# Patient Record
Sex: Male | Born: 1960 | Race: White | Hispanic: No | Marital: Married | Smoking: Former smoker
Health system: Southern US, Community
[De-identification: ages and names within clinical notes are randomized; demographics above are authoritative.]

## PROBLEM LIST (undated history)

## (undated) DIAGNOSIS — I2510 Atherosclerotic heart disease of native coronary artery without angina pectoris: Secondary | ICD-10-CM

## (undated) DIAGNOSIS — E785 Hyperlipidemia, unspecified: Secondary | ICD-10-CM

## (undated) DIAGNOSIS — Q249 Congenital malformation of heart, unspecified: Secondary | ICD-10-CM

## (undated) DIAGNOSIS — I10 Essential (primary) hypertension: Secondary | ICD-10-CM

## (undated) DIAGNOSIS — K7460 Unspecified cirrhosis of liver: Secondary | ICD-10-CM

## (undated) HISTORY — PX: LEG SURGERY: SHX1003

## (undated) HISTORY — DX: Atherosclerotic heart disease of native coronary artery without angina pectoris: I25.10

## (undated) HISTORY — PX: FINGER SURGERY: SHX640

## (undated) HISTORY — DX: Unspecified cirrhosis of liver: K74.60

---
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
## 2019-05-14 ENCOUNTER — Emergency Department (HOSPITAL_COMMUNITY)
Admission: EM | Admit: 2019-05-14 | Discharge: 2019-05-15 | Disposition: A | Discharge status: Home / Self Care | Payer: Medicare PPO | Attending: Emergency Medicine | Admitting: Emergency Medicine

## 2019-05-14 ENCOUNTER — Other Ambulatory Visit: Payer: Self-pay

## 2019-05-14 ENCOUNTER — Emergency Department (HOSPITAL_COMMUNITY): Payer: Medicare PPO

## 2019-05-14 ENCOUNTER — Encounter (HOSPITAL_COMMUNITY): Payer: Self-pay | Admitting: Emergency Medicine

## 2019-05-14 DIAGNOSIS — N200 Calculus of kidney: Secondary | ICD-10-CM | POA: Diagnosis not present

## 2019-05-14 DIAGNOSIS — I10 Essential (primary) hypertension: Secondary | ICD-10-CM | POA: Diagnosis not present

## 2019-05-14 DIAGNOSIS — R1030 Lower abdominal pain, unspecified: Secondary | ICD-10-CM | POA: Diagnosis present

## 2019-05-14 DIAGNOSIS — R748 Abnormal levels of other serum enzymes: Secondary | ICD-10-CM | POA: Insufficient documentation

## 2019-05-14 DIAGNOSIS — Z79899 Other long term (current) drug therapy: Secondary | ICD-10-CM | POA: Diagnosis not present

## 2019-05-14 DIAGNOSIS — R109 Unspecified abdominal pain: Secondary | ICD-10-CM

## 2019-05-14 HISTORY — DX: Essential (primary) hypertension: I10

## 2019-05-14 HISTORY — DX: Congenital malformation of heart, unspecified: Q24.9

## 2019-05-14 HISTORY — DX: Hyperlipidemia, unspecified: E78.5

## 2019-05-14 LAB — CBC
HCT: 48.5 % (ref 39.0–52.0)
Hemoglobin: 16.2 g/dL (ref 13.0–17.0)
MCH: 31.6 pg (ref 26.0–34.0)
MCHC: 33.4 g/dL (ref 30.0–36.0)
MCV: 94.5 fL (ref 80.0–100.0)
Platelets: 255 10*3/uL (ref 150–400)
RBC: 5.13 MIL/uL (ref 4.22–5.81)
RDW: 12.5 % (ref 11.5–15.5)
WBC: 8 10*3/uL (ref 4.0–10.5)
nRBC: 0 % (ref 0.0–0.2)

## 2019-05-14 LAB — COMPREHENSIVE METABOLIC PANEL
ALT: 41 U/L (ref 0–44)
AST: 28 U/L (ref 15–41)
Albumin: 4.2 g/dL (ref 3.5–5.0)
Alkaline Phosphatase: 65 U/L (ref 38–126)
Anion gap: 11 (ref 5–15)
BUN: 22 mg/dL — ABNORMAL HIGH (ref 6–20)
CO2: 24 mmol/L (ref 22–32)
Calcium: 9.6 mg/dL (ref 8.9–10.3)
Chloride: 101 mmol/L (ref 98–111)
Creatinine, Ser: 0.92 mg/dL (ref 0.61–1.24)
GFR calc Af Amer: >60 mL/min (ref >60)
GFR calc non Af Amer: >60 mL/min (ref >60)
Glucose, Bld: 102 mg/dL — ABNORMAL HIGH (ref 70–99)
Potassium: 3.6 mmol/L (ref 3.5–5.1)
Sodium: 136 mmol/L (ref 135–145)
Total Bilirubin: 0.6 mg/dL (ref 0.3–1.2)
Total Protein: 7.7 g/dL (ref 6.5–8.1)

## 2019-05-14 LAB — URINALYSIS, ROUTINE W REFLEX MICROSCOPIC
Bacteria, UA: NONE SEEN
Bilirubin Urine: NEGATIVE
Glucose, UA: NEGATIVE mg/dL
Ketones, ur: NEGATIVE mg/dL
Leukocytes,Ua: NEGATIVE
Nitrite: NEGATIVE
Protein, ur: NEGATIVE mg/dL
Specific Gravity, Urine: 1.017 (ref 1.005–1.030)
pH: 5 (ref 5.0–8.0)

## 2019-05-14 LAB — LIPASE, BLOOD: Lipase: 119 U/L — ABNORMAL HIGH (ref 11–51)

## 2019-05-14 MED ORDER — NAPROXEN 500 MG PO TABS: 500.0000 mg | ORAL_TABLET | Freq: Two times a day (BID) | ORAL | 0 refills | Status: DC | PRN

## 2019-05-14 MED ORDER — DICYCLOMINE HCL 20 MG PO TABS: 20.0000 mg | ORAL_TABLET | Freq: Two times a day (BID) | ORAL | 0 refills | Status: DC

## 2019-05-14 MED ORDER — ONDANSETRON 4 MG PO TBDP: 4.0000 mg | ORAL_TABLET | Freq: Three times a day (TID) | ORAL | 0 refills | Status: DC | PRN

## 2019-05-14 MED ORDER — TAMSULOSIN HCL 0.4 MG PO CAPS: 0.4000 mg | ORAL_CAPSULE | Freq: Every day | ORAL | 0 refills | Status: AC

## 2019-05-14 NOTE — ED Provider Notes (Signed)
[facility] EMERGENCY DEPARTMENT Provider Note   CSN: [id number] Arrival date & time: 05/14/19  2134     History Chief Complaint  Patient presents with  . Abdominal Pain    Jerry Rasmussen is a 59 y.o. male.  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  He reports that he was in his usual state of health until approximately 1 week ago when he started to develop some abdominal distention.  He has been able to eat and drink but states that he does not want to eat very much.  There has been no fevers or chills, no nausea or vomiting, no diarrhea though he does report that his stools have changed to be long and stringy, no blood in the stool.  The abdominal distention seems to be getting worse, initially it was in the upper abdomen but now he feels more pressure and pain in the lower abdomen and points to the suprapubic area both right and left lower quadrants.  He does report a prior history of kidney stones as well, he has not seen any blood in his urine.  He has never been diagnosed with ascites nor has he ever had paracentesis  Past Medical History:  Diagnosis Date  . Cardiac abnormality   . HTN (hypertension)   . Hyperlipidemia     There are no problems to display for this patient.   History reviewed. No pertinent surgical history.     History reviewed. No pertinent family history.  Social History   Tobacco Use  . Smoking status: Never Smoker  . Smokeless tobacco: Never Used  Substance Use Topics  . Alcohol use: Not Currently  . Drug use: Never    Home Medications Prior to  Admission medications   Medication Sig Start Date End Date Taking? Authorizing Provider  aspirin EC 325 MG tablet Take 325 mg by mouth in the morning and at bedtime.   Yes [provider]  atenolol (TENORMIN) 50 MG tablet Take 50 mg by mouth in the morning and at bedtime.   Yes [provider]  esomeprazole (NEXIUM) 40 MG capsule Take 40 mg by mouth every morning. 03/27/19  Yes [provider]  fluticasone (FLONASE) 50 MCG/ACT nasal spray Place 2 sprays into both nostrils daily. 04/23/19  Yes [provider]  lisinopril-hydrochlorothiazide (ZESTORETIC) 20-12.5 MG tablet Take 1 tablet by mouth daily.   Yes [provider]  Multiple Vitamin (MULTIVITAMIN WITH MINERALS) TABS tablet Take 1 tablet by mouth daily.   Yes [provider]  Omega-3 Fatty Acids (FISH OIL) 1200 MG CAPS Take 2 capsules by mouth daily.   Yes [provider]  REPATHA 140 MG/ML SOSY Inject 140 mg into the skin every 14 (fourteen) days. 04/22/19  Yes [provider]  simvastatin (ZOCOR) 80 MG tablet Take 80 mg by mouth daily.   Yes [provider]  dicyclomine (BENTYL) 20 MG tablet Take 1 tablet (20 mg total) by mouth 2 (two) times daily. 05/14/19   Noemi Chapel, MD  naproxen (NAPROSYN) 500 MG tablet Take 1 tablet (500 mg total) by mouth 2 (  two) times daily as needed for moderate pain. 05/14/19   Eber Hong, MD  ondansetron (ZOFRAN ODT) 4 MG disintegrating tablet Take 1 tablet (4 mg total) by mouth every 8 (eight) hours as needed for nausea. 05/14/19   Eber Hong, MD  tamsulosin (FLOMAX) 0.4 MG CAPS capsule Take 1 capsule (0.4 mg total) by mouth daily for 5 days. 05/14/19 05/19/19  Eber Hong, MD    Allergies    Codeine  Review of Systems   Review of Systems  All other systems reviewed and are negative.   Physical Exam Updated Vital Signs BP (!) 155/106 (BP Location: Right Arm)   Pulse 70   Temp 98.5 F (36.9 C) (Oral)   Resp 18   Ht  1.829 m (6')   Wt 100.2 kg   SpO2 100%   BMI 29.97 kg/m   Physical Exam Vitals and nursing note reviewed.  Constitutional:      General: He is not in acute distress.    Appearance: He is well-developed.  HENT:     Head: Normocephalic and atraumatic.     Mouth/Throat:     Pharynx: No oropharyngeal exudate.  Eyes:     General: No scleral icterus.       Right eye: No discharge.        Left eye: No discharge.     Conjunctiva/sclera: Conjunctivae normal.     Pupils: Pupils are equal, round, and reactive to light.  Neck:     Thyroid: No thyromegaly.     Vascular: No JVD.  Cardiovascular:     Rate and Rhythm: Normal rate and regular rhythm.     Heart sounds: Normal heart sounds. No murmur. No friction rub. No gallop.   Pulmonary:     Effort: Pulmonary effort is normal. No respiratory distress.     Breath sounds: Normal breath sounds. No wheezing or rales.  Abdominal:     General: Bowel sounds are normal. There is no distension.     Palpations: Abdomen is soft. There is no mass.     Tenderness: There is abdominal tenderness.     Comments: Minimal abdominal tenderness to the lower left quadrant and right lower quadrant, no guarding or peritoneal signs, no right upper quadrant tenderness or Murphy sign, no epigastric tenderness, the patient is mildly distended but has no tympanic sounds in fact is quite dull to percussion.  There is no fluid wave  Musculoskeletal:        General: No tenderness. Normal range of motion.     Cervical back: Normal range of motion and neck supple.     Comments: There is no edema of the lower extremities.  His left lower extremity has some postsurgical findings but no significant edema and at the patient's baseline  Lymphadenopathy:     Cervical: No cervical adenopathy.  Skin:    General: Skin is warm and dry.     Findings: No erythema or rash.  Neurological:     Mental Status: He is alert.     Coordination: Coordination normal.  Psychiatric:         Behavior: Behavior normal.     ED Results / Procedures / Treatments   Labs (all labs ordered are listed, but only abnormal results are displayed) Labs Reviewed  URINALYSIS, ROUTINE W REFLEX MICROSCOPIC - Abnormal; Notable for the following components:      Result Value   Hgb urine dipstick LARGE (*)    All other components within normal limits  LIPASE,  BLOOD - Abnormal; Notable for the following components:   Lipase 119 (*)    All other components within normal limits  COMPREHENSIVE METABOLIC PANEL - Abnormal; Notable for the following components:   Glucose, Bld 102 (*)    BUN 22 (*)    All other components within normal limits  CBC    EKG None  Radiology CT Renal Stone Study  Result Date: 05/14/2019 CLINICAL DATA:  Right-sided flank pain EXAM: CT ABDOMEN AND PELVIS WITHOUT CONTRAST TECHNIQUE: Multidetector CT imaging of the abdomen and pelvis was performed following the standard protocol without IV contrast. COMPARISON:  None. FINDINGS: Lower chest: Lung bases demonstrate no acute consolidation or effusion. The heart size is normal. Hepatobiliary: No focal liver abnormality is seen. No gallstones, gallbladder wall thickening, or biliary dilatation. Pancreas: Unremarkable. No pancreatic ductal dilatation or surrounding inflammatory changes. Spleen: Normal in size without focal abnormality. Adrenals/Urinary Tract: Adrenal glands are normal. Multiple intrarenal stones on the right measuring up to 3 mm at the midpole. Punctate stone in the lower pole left kidney. Mild right hydronephrosis, secondary to a 5 mm stone at the right UPJ. Urinary bladder is nearly empty. Stomach/Bowel: Stomach is within normal limits. Appendix appears normal. No evidence of bowel wall thickening, distention, or inflammatory changes. Mild sigmoid colon diverticular disease. Vascular/Lymphatic: Moderate aortic atherosclerosis without aneurysm. No significantly enlarged lymph nodes Reproductive: Prostate is  unremarkable.  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 Mild right hydronephrosis, secondary to a 5 mm stone at the right UPJ. 2. There are right greater than left intrarenal stones 3. Sigmoid colon diverticular disease without acute inflammatory process Electronically Signed   By: Jasmine Pang M.D.   On: 05/14/2019 22:36    Procedures Procedures (including critical care time)  Medications Ordered in ED Medications - No data to display  ED Course  I have reviewed the triage vital signs and the nursing notes.  Pertinent labs & imaging results that were available during my care of the patient were reviewed by me and considered in my medical decision making (see chart for details).    MDM Rules/Calculators/A&P                       This patient presents to the ED for concern of abdominal pain and distention, this involves an extensive number of treatment options, and is a complaint that carries with it a high risk of complications and morbidity.  The differential diagnosis includes cirrhosis, cholecystitis, appendicitis, diverticulitis, ascites, pancreatitis, kidney stone   Lab Tests:   I Ordered, reviewed, and interpreted labs, which included CBC, metabolic panel, lipase, urinalysis, there was mild hematuria, no leukocytosis, no renal dysfunction, borderline lipase but no correlation with abnormal findings on CT scan.   Imaging Studies ordered:   I ordered imaging studies which included CT scan renal protocol and  I independently visualized and interpreted imaging which showed no signs of pancreatitis, there is a proximal kidney stone which may be causing some of the hematuria seen on the urinalysis  Additional history obtained:   Additional history obtained from patient  Previous records obtained and reviewed -not available  Consultations  Obtained:   I consulted with the patient and discussed lab and imaging findings  Reevaluation:  After the interventions stated above, I reevaluated the patient and found the patient is very stable, has no upper abdominal tenderness whatsoever  making pancreatitis much less likely.  He is aware of the kidney stone, I think he may have some irritable bowel syndrome and will prescribe dicyclomine as well as kidney stone medications.  Avoiding opiates and giving local referrals for gastroenterology and family doctors, the patient is appreciative and stable at discharge   Final Clinical Impression(s) / ED Diagnoses Final diagnoses:  Abdominal pain, unspecified abdominal location  Elevated lipase  Kidney stone    Rx / DC Orders ED Discharge Orders         Ordered    dicyclomine (BENTYL) 20 MG tablet  2 times daily     05/14/19 2345    ondansetron (ZOFRAN ODT) 4 MG disintegrating tablet  Every 8 hours PRN     05/14/19 2345    naproxen (NAPROSYN) 500 MG tablet  2 times daily PRN     05/14/19 2345    tamsulosin (FLOMAX) 0.4 MG CAPS capsule  Daily     05/14/19 2345           Eber Hong, MD 05/14/19 2348

## 2019-05-14 NOTE — Discharge Instructions (Addendum)
Your testing today did not reveal any obvious abnormalities.  You had a slightly elevated lipase but it was not high enough to mean that you had pancreatitis.  I would recommend that you going to a clear liquid diet for the next 24 hours , the CT scan did not show any problems with your pancreas.  The kidney stone on your CT scan is approximately 5 mm, this may cause some pain as it passes so please take the following medications  Naproxen, up to 500 mg twice a day as needed for pain Zofran, 4 mg by mouth every 8 hours as needed for nausea Flomax, 1 capsule daily for the next 5 days  Your abdominal distention and discomfort and bloating may also be related to irritable bowel syndrome.  Please take dicyclomine 1 tablet every 6 hours as needed.  Please follow-up with Dr. Darrick Penna, local gastroenterology and see the list below for local family doctors.  Return to the emergency department for severe or worsening symptoms.  [facility] Primary Care Doctor List    Kari Baars MD. Specialty: Pulmonary Disease Contact information: [address]  [address]  [place] [address]  [phone]   Syliva Overman, MD. Specialty: [facility] Medicine Contact information: [address]  [place] [address]  [phone]   Lilyan Punt, MD. Specialty: [facility] Medicine Contact information: [address]  [place] [address]  [phone]   Avon Gully, MD Specialty: Internal Medicine Contact information: [address] [place] [address]  [phone]   Catalina Pizza, MD. Specialty: Internal Medicine Contact information: [address]  [place] [address]  [phone]    [facility] (Dr. Selena Batten) Specialty: Family Medicine Contact information: [address]  [place] [address]  [phone]   John Giovanni, MD. Specialty: [facility] Medicine Contact information: [address]  [address]  [place] [address]  [phone]   Carylon Perches,  MD. Specialty: Internal Medicine Contact information: [address]  [address]  [place] [address]  [phone]    [facility]  [address], [place] [address] [phone]  Services [facility] offers a variety of basic health services.  Services include but are not limited to: Blood pressure checks  Heart rate checks  Blood sugar checks  Urine analysis  Rapid strep tests  Pregnancy tests.  Health education and referrals  People needing more complex services will be directed to a physician online. Using these virtual visits, doctors can evaluate and prescribe medicine and treatments. There will be no medication on-site, though [facility] will help patients fill their prescriptions at little to no cost.   For More information please go to: [email or web address]

## 2019-05-14 NOTE — ED Triage Notes (Signed)
Patient presents with lower abdominal pain. Patient states pain started about 1 week ago. Patient does have some abdominal distention and right sided flank pain. Patient complains of nausea.Patient has had previous abdominal ultrasounds but states pain has become worse.

## 2019-07-15 ENCOUNTER — Telehealth: Payer: Self-pay | Admitting: Internal Medicine

## 2019-07-15 ENCOUNTER — Encounter: Payer: Self-pay | Admitting: Internal Medicine

## 2019-07-15 NOTE — Telephone Encounter (Signed)
Ok to schedule ov.  

## 2019-07-15 NOTE — Telephone Encounter (Signed)
ER REFERRAL  

## 2019-07-15 NOTE — Telephone Encounter (Signed)
SCHEDULED AND LETTER SENT  °

## 2019-09-03 ENCOUNTER — Telehealth: Payer: Self-pay

## 2019-09-03 ENCOUNTER — Ambulatory Visit: Payer: Medicare PPO | Admitting: Gastroenterology

## 2019-09-03 NOTE — Telephone Encounter (Signed)
Pt thought his apt was at 9 AM. Pt's spouse will need to r/s new pt appointment.

## 2019-09-03 NOTE — Telephone Encounter (Signed)
Reschedule patient to come tomorrow. He is aware.

## 2019-09-03 NOTE — Progress Notes (Deleted)
Primary Care Physician:  Patient, No Pcp Per  Primary Gastroenterologist:    No chief complaint on file.   HPI:  Jerry Rasmussen is a 59 y.o. male here    CT renal protocol April 2021 showed mild right hydronephrosis, secondary to 5 mm stone at the right UPJ.  Bilateral intrarenal stones.  Sigmoid diverticular disease.  Current Outpatient Medications  Medication Sig Dispense Refill  . aspirin EC 325 MG tablet Take 325 mg by mouth in the morning and at bedtime.    Marland Kitchen atenolol (TENORMIN) 50 MG tablet Take 50 mg by mouth in the morning and at bedtime.    . dicyclomine (BENTYL) 20 MG tablet Take 1 tablet (20 mg total) by mouth 2 (two) times daily. 20 tablet 0  . esomeprazole (NEXIUM) 40 MG capsule Take 40 mg by mouth every morning.    . fluticasone (FLONASE) 50 MCG/ACT nasal spray Place 2 sprays into both nostrils daily.    Marland Kitchen lisinopril-hydrochlorothiazide (ZESTORETIC) 20-12.5 MG tablet Take 1 tablet by mouth daily.    . Multiple Vitamin (MULTIVITAMIN WITH MINERALS) TABS tablet Take 1 tablet by mouth daily.    . naproxen (NAPROSYN) 500 MG tablet Take 1 tablet (500 mg total) by mouth 2 (two) times daily as needed for moderate pain. 20 tablet 0  . Omega-3 Fatty Acids (FISH OIL) 1200 MG CAPS Take 2 capsules by mouth daily.    . ondansetron (ZOFRAN ODT) 4 MG disintegrating tablet Take 1 tablet (4 mg total) by mouth every 8 (eight) hours as needed for nausea. 10 tablet 0  . REPATHA 140 MG/ML SOSY Inject 140 mg into the skin every 14 (fourteen) days.    . simvastatin (ZOCOR) 80 MG tablet Take 80 mg by mouth daily.     No current facility-administered medications for this visit.    Allergies as of 09/03/2019 - Review Complete 05/14/2019  Allergen Reaction Noted  . Codeine  05/14/2019    Past Medical History:  Diagnosis Date  . Cardiac abnormality   . HTN (hypertension)   . Hyperlipidemia     No past surgical history on file.  No family history on file.  Social History    Socioeconomic History  . Marital status: Married    Spouse name: Not on file  . Number of children: Not on file  . Years of education: Not on file  . Highest education level: Not on file  Occupational History  . Not on file  Tobacco Use  . Smoking status: Never Smoker  . Smokeless tobacco: Never Used  Substance and Sexual Activity  . Alcohol use: Not Currently  . Drug use: Never  . Sexual activity: Not on file  Other Topics Concern  . Not on file  Social History Narrative  . Not on file   Social Determinants of Health   Financial Resource Strain:   . Difficulty of Paying Living Expenses:   Food Insecurity:   . Worried About Programme researcher, broadcasting/film/video in the Last Year:   . Barista in the Last Year:   Transportation Needs:   . Freight forwarder (Medical):   Marland Kitchen Lack of Transportation (Non-Medical):   Physical Activity:   . Days of Exercise per Week:   . Minutes of Exercise per Session:   Stress:   . Feeling of Stress :   Social Connections:   . Frequency of Communication with Friends and Family:   . Frequency of Social Gatherings with Friends and Family:   .  Attends Religious Services:   . Active Member of Clubs or Organizations:   . Attends Banker Meetings:   Marland Kitchen Marital Status:   Intimate Partner Violence:   . Fear of Current or Ex-Partner:   . Emotionally Abused:   Marland Kitchen Physically Abused:   . Sexually Abused:       ROS:  General: Negative for anorexia, weight loss, fever, chills, fatigue, weakness. Eyes: Negative for vision changes.  ENT: Negative for hoarseness, difficulty swallowing , nasal congestion. CV: Negative for chest pain, angina, palpitations, dyspnea on exertion, peripheral edema.  Respiratory: Negative for dyspnea at rest, dyspnea on exertion, cough, sputum, wheezing.  GI: See history of present illness. GU:  Negative for dysuria, hematuria, urinary incontinence, urinary frequency, nocturnal urination.  MS: Negative for joint  pain, low back pain.  Derm: Negative for rash or itching.  Neuro: Negative for weakness, abnormal sensation, seizure, frequent headaches, memory loss, confusion.  Psych: Negative for anxiety, depression, suicidal ideation, hallucinations.  Endo: Negative for unusual weight change.  Heme: Negative for bruising or bleeding. Allergy: Negative for rash or hives.    Physical Examination:  There were no vitals taken for this visit.   General: Well-nourished, well-developed in no acute distress.  Head: Normocephalic, atraumatic.   Eyes: Conjunctiva pink, no icterus. Mouth: Oropharyngeal mucosa moist and pink , no lesions erythema or exudate. Neck: Supple without thyromegaly, masses, or lymphadenopathy.  Lungs: Clear to auscultation bilaterally.  Heart: Regular rate and rhythm, no murmurs rubs or gallops.  Abdomen: Bowel sounds are normal, nontender, nondistended, no hepatosplenomegaly or masses, no abdominal bruits or    hernia , no rebound or guarding.   Rectal: *** Extremities: No lower extremity edema. No clubbing or deformities.  Neuro: Alert and oriented x 4 , grossly normal neurologically.  Skin: Warm and dry, no rash or jaundice.   Psych: Alert and cooperative, normal mood and affect.  Labs: Lab Results  Component Value Date   CREATININE 0.92 05/14/2019   BUN 22 (H) 05/14/2019   NA 136 05/14/2019   K 3.6 05/14/2019   CL 101 05/14/2019   CO2 24 05/14/2019   Lab Results  Component Value Date   ALT 41 05/14/2019   AST 28 05/14/2019   ALKPHOS 65 05/14/2019   BILITOT 0.6 05/14/2019   Lab Results  Component Value Date   WBC 8.0 05/14/2019   HGB 16.2 05/14/2019   HCT 48.5 05/14/2019   MCV 94.5 05/14/2019   PLT 255 05/14/2019   Lab Results  Component Value Date   LIPASE 119 (H) 05/14/2019     Imaging Studies: No results found.

## 2019-09-04 ENCOUNTER — Ambulatory Visit: Payer: Medicare PPO | Admitting: Nurse Practitioner

## 2019-09-04 ENCOUNTER — Encounter: Payer: Self-pay | Admitting: Nurse Practitioner

## 2019-09-04 ENCOUNTER — Other Ambulatory Visit: Payer: Self-pay

## 2019-09-04 ENCOUNTER — Encounter: Payer: Self-pay | Admitting: *Deleted

## 2019-09-04 DIAGNOSIS — K219 Gastro-esophageal reflux disease without esophagitis: Secondary | ICD-10-CM

## 2019-09-04 DIAGNOSIS — K7460 Unspecified cirrhosis of liver: Secondary | ICD-10-CM | POA: Diagnosis not present

## 2019-09-04 MED ORDER — ESOMEPRAZOLE MAGNESIUM 40 MG PO CPDR: 40.0000 mg | DELAYED_RELEASE_CAPSULE | Freq: Two times a day (BID) | ORAL | 5 refills | Status: DC

## 2019-09-04 NOTE — Progress Notes (Signed)
No pcp per patient 
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
## 2019-09-04 NOTE — Patient Instructions (Signed)
Your health issues we discussed today were:   Previous diagnosis of cirrhosis: 1. As discussed, [place] 2. Have your labs completed when you are able to 3. We will help schedule your ultrasound for you and we will call you with the appointment time 4. Call us if you have any worsening or severe symptoms  GERD (reflux/heartburn): 1. I sent a new prescription to your pharmacy increase your esomeprazole 40 mg to twice a day.  Take this first thing in the morning on empty stomach 30 minutes for your last meal the day 2. You can continue to use the pills you have on hand, which she will run out sooner than expected 3. When you run out you can get a new prescription for your pharmacy 4. Call us for any worsening or severe symptoms  Overall I recommend:  1. Continue your other current medications 2. Return for follow-up in 3 months 3. Call us for any questions or concerns.  ---------------------------------------------------------------  I am glad you have gotten your COVID-19 vaccination!  Even though you are fully vaccinated you should continue to follow CDC and state/local guidelines.  ---------------------------------------------------------------   At [facility] we value your feedback. You may receive a survey about your visit today. Please share your experience as we strive to create trusting relationships with our patients to provide genuine, compassionate, quality care.  We appreciate your understanding and patience as we review any laboratory studies, imaging, and other diagnostic tests that are ordered as we care for you. Our office policy is 5 business days for review of these results, and any emergent or urgent results are addressed in a timely manner for your best interest. If you do not hear from our office in 1 week, please contact us.   We also encourage the use of MyChart, which contains your medical information  for your review as well. If you are not enrolled in this feature, an access code is on this after visit summary for your convenience. Thank you for allowing [place] to be involved in your care.  It was great to see you today!  I hope you have a great rest of your summer!!

## 2019-09-05 LAB — CBC WITH DIFFERENTIAL/PLATELET
Absolute Monocytes: 834 cells/uL (ref 200–950)
Basophils Absolute: 14 cells/uL (ref 0–200)
Basophils Relative: 0.1 %
Eosinophils Absolute: 14 cells/uL — ABNORMAL LOW (ref 15–500)
Eosinophils Relative: 0.1 %
HCT: 49.9 % (ref 38.5–50.0)
Hemoglobin: 17.1 g/dL (ref 13.2–17.1)
Lymphs Abs: 1793 cells/uL (ref 850–3900)
MCH: 32.1 pg (ref 27.0–33.0)
MCHC: 34.3 g/dL (ref 32.0–36.0)
MCV: 93.6 fL (ref 80.0–100.0)
MPV: 10.3 fL (ref 7.5–12.5)
Monocytes Relative: 6 %
Neutro Abs: 11245 cells/uL — ABNORMAL HIGH (ref 1500–7800)
Neutrophils Relative %: 80.9 %
Platelets: 234 10*3/uL (ref 140–400)
RBC: 5.33 10*6/uL (ref 4.20–5.80)
RDW: 12.6 % (ref 11.0–15.0)
Total Lymphocyte: 12.9 %
WBC: 13.9 10*3/uL — ABNORMAL HIGH (ref 3.8–10.8)

## 2019-09-05 LAB — COMPREHENSIVE METABOLIC PANEL
AG Ratio: 1.8 (calc) (ref 1.0–2.5)
ALT: 23 U/L (ref 9–46)
AST: 20 U/L (ref 10–35)
Albumin: 4.8 g/dL (ref 3.6–5.1)
Alkaline phosphatase (APISO): 64 U/L (ref 35–144)
BUN: 21 mg/dL (ref 7–25)
CO2: 26 mmol/L (ref 20–32)
Calcium: 10.6 mg/dL — ABNORMAL HIGH (ref 8.6–10.3)
Chloride: 102 mmol/L (ref 98–110)
Creat: 0.98 mg/dL (ref 0.70–1.33)
Globulin: 2.7 g/dL (calc) (ref 1.9–3.7)
Glucose, Bld: 96 mg/dL (ref 65–99)
Potassium: 4.5 mmol/L (ref 3.5–5.3)
Sodium: 139 mmol/L (ref 135–146)
Total Bilirubin: 0.6 mg/dL (ref 0.2–1.2)
Total Protein: 7.5 g/dL (ref 6.1–8.1)

## 2019-09-05 LAB — AFP TUMOR MARKER: AFP-Tumor Marker: 4.2 ng/mL (ref <6.1)

## 2019-09-05 LAB — PROTIME-INR
INR: 1
Prothrombin Time: 10.4 s (ref 9.0–11.5)

## 2019-09-10 ENCOUNTER — Other Ambulatory Visit: Payer: Self-pay

## 2019-09-10 ENCOUNTER — Ambulatory Visit (HOSPITAL_COMMUNITY)
Admission: RE | Admit: 2019-09-10 | Discharge: 2019-09-10 | Disposition: A | Discharge status: Home / Self Care | Payer: Medicare PPO | Source: Ambulatory Visit | Attending: Nurse Practitioner | Admitting: Nurse Practitioner

## 2019-09-10 DIAGNOSIS — K219 Gastro-esophageal reflux disease without esophagitis: Secondary | ICD-10-CM | POA: Diagnosis present

## 2019-09-10 DIAGNOSIS — K7460 Unspecified cirrhosis of liver: Secondary | ICD-10-CM | POA: Diagnosis present

## 2019-09-16 ENCOUNTER — Other Ambulatory Visit: Payer: Self-pay | Admitting: Nurse Practitioner

## 2019-09-16 DIAGNOSIS — K769 Liver disease, unspecified: Secondary | ICD-10-CM

## 2019-09-25 ENCOUNTER — Telehealth: Payer: Self-pay | Admitting: *Deleted

## 2019-09-25 ENCOUNTER — Telehealth: Payer: Self-pay | Admitting: Internal Medicine

## 2019-09-25 DIAGNOSIS — K7460 Unspecified cirrhosis of liver: Secondary | ICD-10-CM

## 2019-09-25 DIAGNOSIS — R935 Abnormal findings on diagnostic imaging of other abdominal regions, including retroperitoneum: Secondary | ICD-10-CM

## 2019-09-25 DIAGNOSIS — K769 Liver disease, unspecified: Secondary | ICD-10-CM

## 2019-09-25 NOTE — Telephone Encounter (Signed)
Pt called in and wanted to follow up on recommendations based off of his results from imaging.  Can also refer to result note from imaging.  Wynne Dust, NP: Pt states that he can not do MRI due to wires from bypass surgery. He wants to know if you have reviewed records from [facility] and what your next step might be. He said he could do more blood work if needed. I asked him if he had been feeling bad lately based off of the result note from labs and your note. He said that he is having rt sided and abd pain with bloating.

## 2019-09-25 NOTE — Telephone Encounter (Signed)
Called pt and informed him that I sent a note to Wynne Dust, NP and would be back in touch with him.

## 2019-09-25 NOTE — Telephone Encounter (Signed)
[phone] PATIENT SAID HE SPOKE TO YOU A WEEK AGO ABOUT HIS RESULTS AND YOU WERE SUPPOSED TO DISCUSS SOMETHING WITH THE DOCTOR AND GET BACK WITH HIM AND HE HAS NOT HEARD BACK

## 2019-10-04 NOTE — Addendum Note (Signed)
Addended by: Delane Ginger, Arya Luttrull A on: 10/04/2019 11:45 AM   Modules accepted: Orders

## 2019-10-04 NOTE — Telephone Encounter (Signed)
Wynne Dust, NP:  Did you get to discuss situation with Dr. Tyron Russell?

## 2019-10-04 NOTE — Telephone Encounter (Signed)
Please tell the patient I was able to discuss with the Radiologist. [facility] plan to do a CT of the Liver to evaluate since we can't do the MRI.  Also, we got a fax back from [facility] stating the don't have any endoscopy (EGD or TCS) records.  I will put the CT order in, they should get him scheduled soon.

## 2019-10-04 NOTE — Progress Notes (Signed)
Received fax back from [facility], no records found.

## 2019-10-04 NOTE — Telephone Encounter (Signed)
See other phone note

## 2019-10-07 NOTE — Telephone Encounter (Signed)
Lmom for pt to call back. 

## 2019-10-08 NOTE — Telephone Encounter (Signed)
Called pt and informed him of Wynne Dust, NP's recommendations.  Pt informed me that [facility] ordered u/s of liver and that is what he wanted Wynne Dust, NP to review.  He said that the EGD/TCS was done in [place], [place].  Pt agreed to proceed with CT.  Pt going out of town this week.  Will need it done next week afterThurs.

## 2019-10-08 NOTE — Telephone Encounter (Signed)
PA approved via [employer]. Auth# [id number] dates 10/08/2019-11/07/2019

## 2019-10-08 NOTE — Telephone Encounter (Signed)
PA pending review. Tracking # [id number]. Clinicals faxed

## 2019-10-08 NOTE — Telephone Encounter (Signed)
CT scheduled for 9/21 at 10:30am, arrival 10:15am, npo 4 hrs, p/u oral contrast from [facility] radiology  Called pt and he is aware if appt details. He voiced understanding and needed nothing further

## 2019-10-15 ENCOUNTER — Ambulatory Visit (HOSPITAL_COMMUNITY): Payer: Medicare PPO

## 2019-10-30 ENCOUNTER — Other Ambulatory Visit: Payer: Self-pay

## 2019-10-30 ENCOUNTER — Ambulatory Visit (HOSPITAL_COMMUNITY)
Admission: RE | Admit: 2019-10-30 | Discharge: 2019-10-30 | Disposition: A | Discharge status: Home / Self Care | Payer: Medicare PPO | Source: Ambulatory Visit | Attending: Nurse Practitioner | Admitting: Nurse Practitioner

## 2019-10-30 DIAGNOSIS — K7460 Unspecified cirrhosis of liver: Secondary | ICD-10-CM | POA: Diagnosis not present

## 2019-10-30 DIAGNOSIS — R935 Abnormal findings on diagnostic imaging of other abdominal regions, including retroperitoneum: Secondary | ICD-10-CM | POA: Diagnosis present

## 2019-10-30 LAB — POCT I-STAT CREATININE: Creatinine, Ser: 1 mg/dL (ref 0.61–1.24)

## 2019-10-30 MED ORDER — IOHEXOL 300 MG/ML  SOLN
100.0000 mL | Freq: Once | INTRAMUSCULAR | Status: AC | PRN
  Administered 2019-10-30: 100 mL via INTRAVENOUS

## 2019-12-05 ENCOUNTER — Encounter: Payer: Self-pay | Admitting: Nurse Practitioner

## 2019-12-05 ENCOUNTER — Other Ambulatory Visit: Payer: Self-pay

## 2019-12-05 ENCOUNTER — Ambulatory Visit: Payer: Medicare PPO | Admitting: Nurse Practitioner

## 2019-12-05 VITALS — BP 117/78 | HR 60 | Temp 97.3°F | Ht 72.0 in | Wt 207.0 lb

## 2019-12-05 DIAGNOSIS — R1011 Right upper quadrant pain: Secondary | ICD-10-CM | POA: Diagnosis not present

## 2019-12-05 DIAGNOSIS — K7460 Unspecified cirrhosis of liver: Secondary | ICD-10-CM | POA: Diagnosis not present

## 2019-12-05 DIAGNOSIS — R109 Unspecified abdominal pain: Secondary | ICD-10-CM | POA: Insufficient documentation

## 2019-12-05 DIAGNOSIS — K219 Gastro-esophageal reflux disease without esophagitis: Secondary | ICD-10-CM | POA: Diagnosis not present

## 2019-12-05 NOTE — Progress Notes (Signed)
NO PCP PER PT 
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
## 2019-12-05 NOTE — Patient Instructions (Addendum)
Your health issues we discussed today were:   Cirrhosis:  1. We will try to get records from [facility] clinic to see how they based your diagnosis of cirrhosis 2. As we discussed, all the imaging, labs, and CTs that we have done cannot find definitive evidence of cirrhosis 3. Until we can hear from [facility] clinic we will continue to treat you like you do have cirrhosis 4. Call us if you have any concerning symptoms or worsening symptoms  GERD (reflux/heartburn):  1. Glad you are feeling better! 2. For the time being continue take Nexium twice a day 3. At your follow-up visit we can discuss reducing your dose to once a day to see how you do 4. Call us for any worsening or severe symptoms  Abdominal pain:  1. As we discussed, I think your abdominal pain is likely from multiple things including scar tissue, kidney stones, back issues, and possibly liver, although we cannot find evidence of cirrhosis as noted above 2. Let us know if your abdominal pain gets worse 3. I would also discuss with your primary care to see if they want to further evaluate for kidney stones and possible treatments versus evaluate your back pain and scar tissue to see if there is any options they can offer  Overall I recommend:  1. Continue your other current medications 2. Call us if you have any other questions or concerns 3. Follow-up in 3 months.   ---------------------------------------------------------------  I am glad you have gotten your COVID-19 vaccination!  Even though you are fully vaccinated you should continue to follow CDC and state/local guidelines.  ---------------------------------------------------------------   At [facility] we value your feedback. You may receive a survey about your visit today. Please share your experience as we strive to create trusting relationships with our patients to provide genuine, compassionate, quality care.  We appreciate your understanding and  patience as we review any laboratory studies, imaging, and other diagnostic tests that are ordered as we care for you. Our office policy is 5 business days for review of these results, and any emergent or urgent results are addressed in a timely manner for your best interest. If you do not hear from our office in 1 week, please contact us.   We also encourage the use of MyChart, which contains your medical information for your review as well. If you are not enrolled in this feature, an access code is on this after visit summary for your convenience. Thank you for allowing [place] to be involved in your care.  It was great to see you today!  I hope you have a Happy Thanksgiving!!

## 2019-12-05 NOTE — Progress Notes (Signed)
NO PCP PER PATIENT °

## 2020-02-26 ENCOUNTER — Other Ambulatory Visit: Payer: Self-pay | Admitting: Gastroenterology

## 2020-02-26 ENCOUNTER — Ambulatory Visit: Payer: Medicare PPO | Admitting: Internal Medicine

## 2020-02-26 ENCOUNTER — Telehealth: Payer: Self-pay

## 2020-02-26 DIAGNOSIS — K219 Gastro-esophageal reflux disease without esophagitis: Secondary | ICD-10-CM

## 2020-02-26 DIAGNOSIS — K7460 Unspecified cirrhosis of liver: Secondary | ICD-10-CM

## 2020-02-26 MED ORDER — ESOMEPRAZOLE MAGNESIUM 40 MG PO CPDR: 40.0000 mg | DELAYED_RELEASE_CAPSULE | Freq: Two times a day (BID) | ORAL | 5 refills | Status: AC

## 2020-02-26 NOTE — Telephone Encounter (Signed)
Refill request received from [facility] pharmacy Esomeprazole Magnesium 40 mg bid.

## 2020-02-26 NOTE — Telephone Encounter (Signed)
Spoke with pt. Pt is aware that RX was sent.

## 2020-02-26 NOTE — Telephone Encounter (Signed)
Rx sent 

## 2021-04-16 IMAGING — US US ABDOMEN COMPLETE W/ ELASTOGRAPHY
2 series · 12 of 25 positions shown · non-contrast
Comparison: CT abdomen and pelvis 05/14/2019

CLINICAL DATA: Cirrhosis, hepatoma screening

EXAM:
ULTRASOUND ABDOMEN
ULTRASOUND HEPATIC ELASTOGRAPHY
TECHNIQUE: Sonography of the upper abdomen was performed. In addition,
ultrasound elastography evaluation of the liver was performed. A
region of interest was placed within the right lobe of the liver.
Following application of a compressive sonographic pulse, tissue
compressibility was assessed. Multiple assessments were performed at
the selected site. Median tissue compressibility was determined.
Previously, hepatic stiffness was assessed by shear wave velocity.
Based on recently published Society of Radiologists in Ultrasound
consensus article, reporting is now recommended to be performed in
the SI units of pressure (kiloPascals) representing hepatic
stiffness/elasticity. The obtained result is compared to the
published reference standards. (cACLD = compensated Advanced Chronic
Liver Disease)

[Series 1: us abdomen complete w/elastography · 11 of 111 slices shown]
[im 6/111]
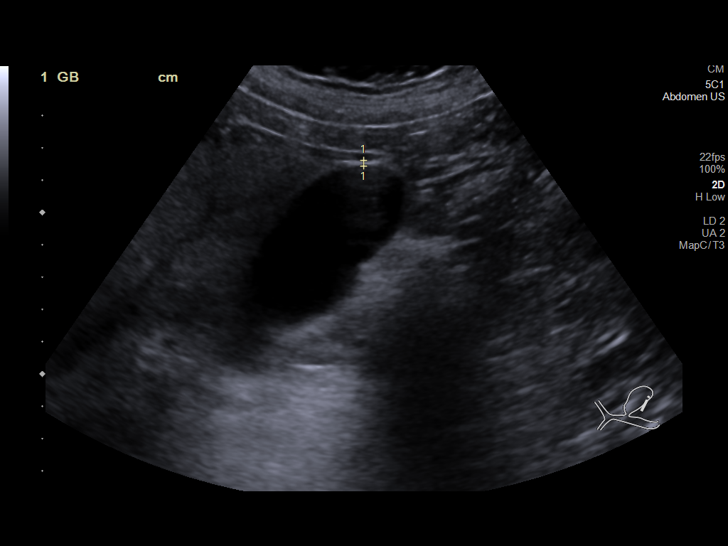
[im 16/111]
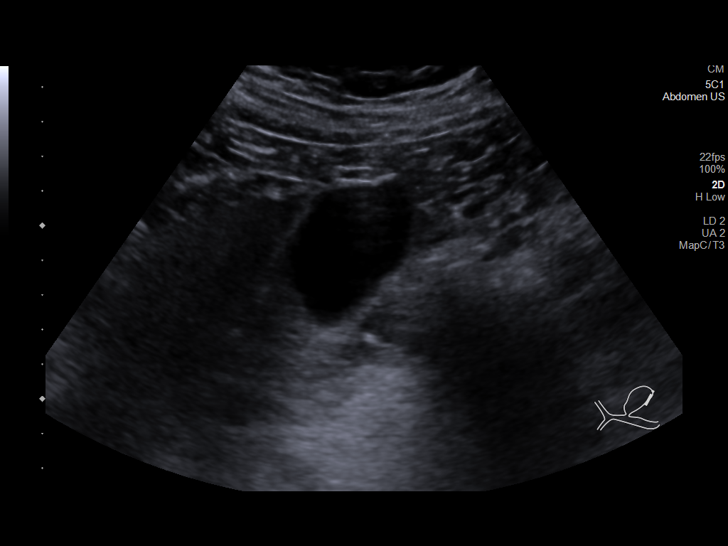
[im 26/111]
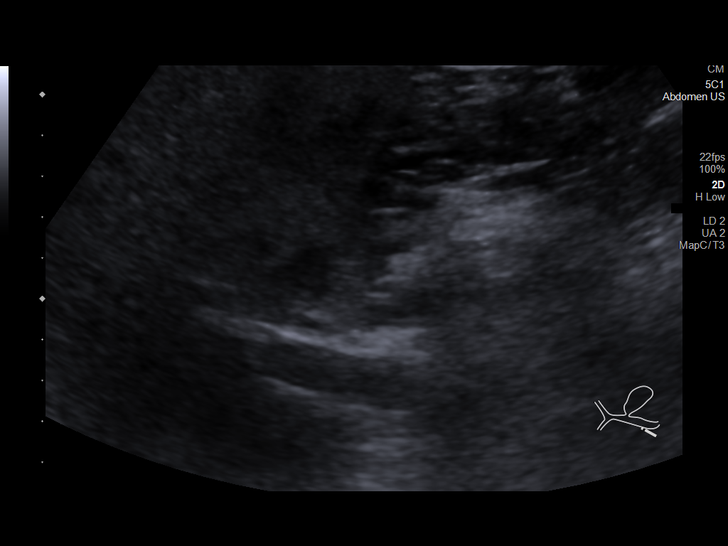
[im 36/111]
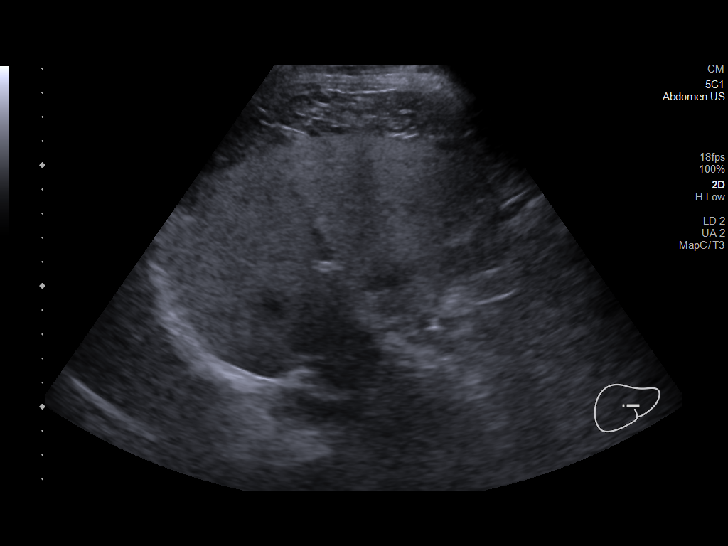
[im 46/111]
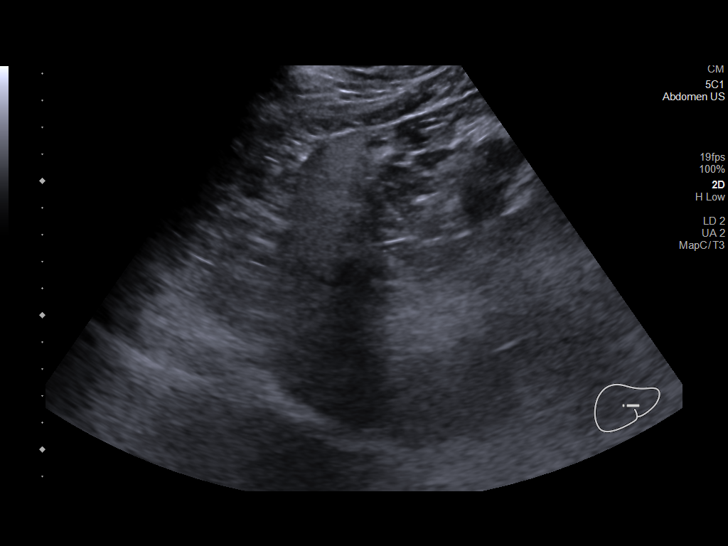
[im 56/111]
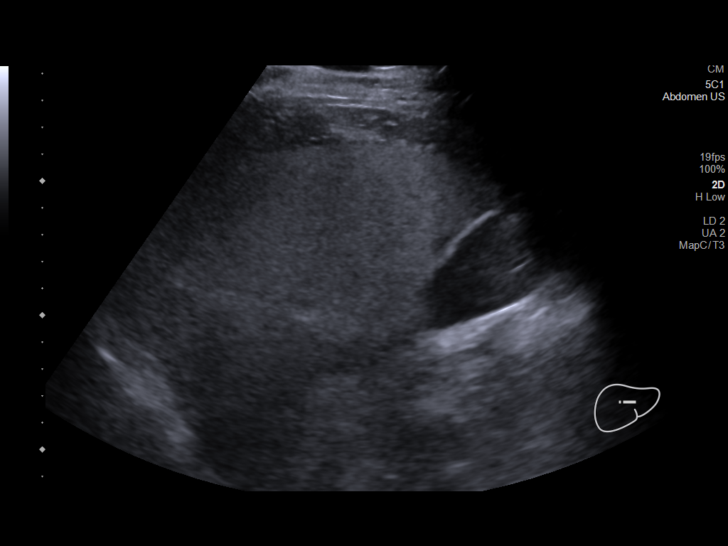
[im 66/111]
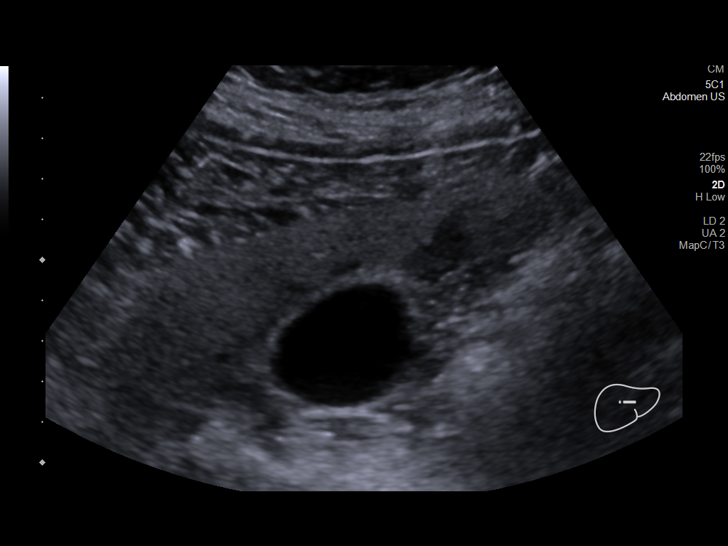
[im 76/111]
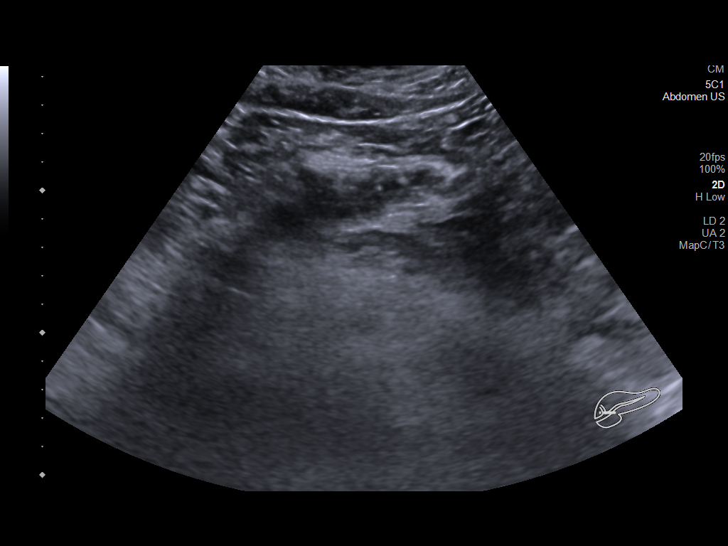
[im 86/111]
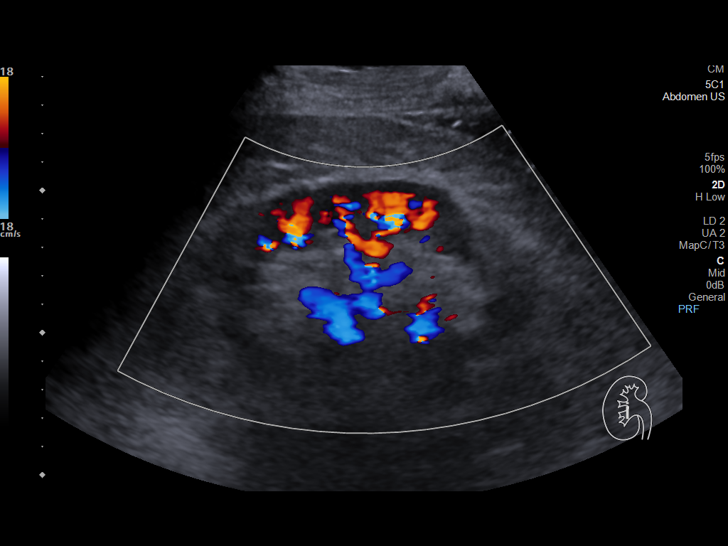
[im 96/111]
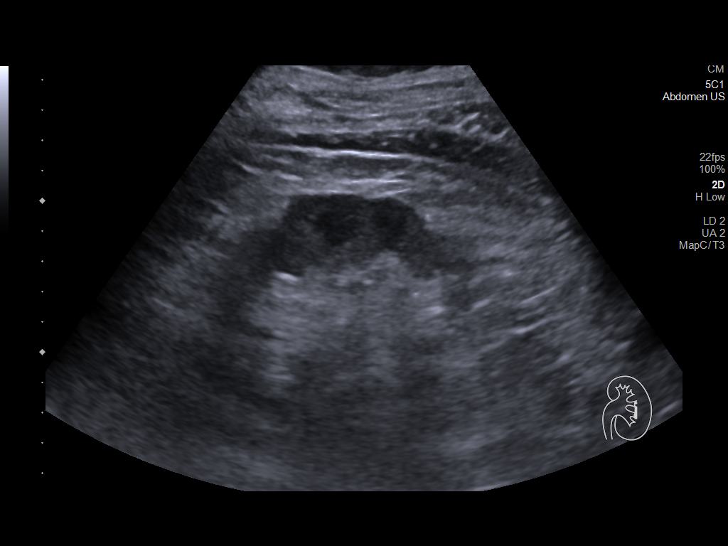
[im 106/111]
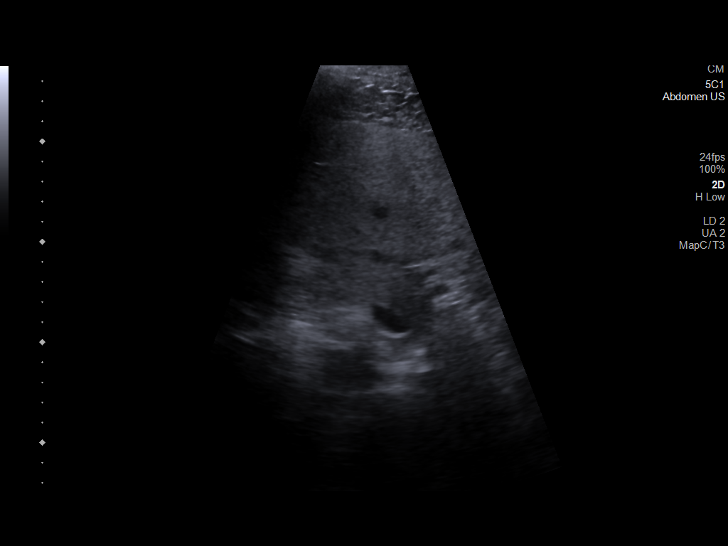

[Series 1001: abdomen us · 1 of 12 slices shown]
[im 1/12]
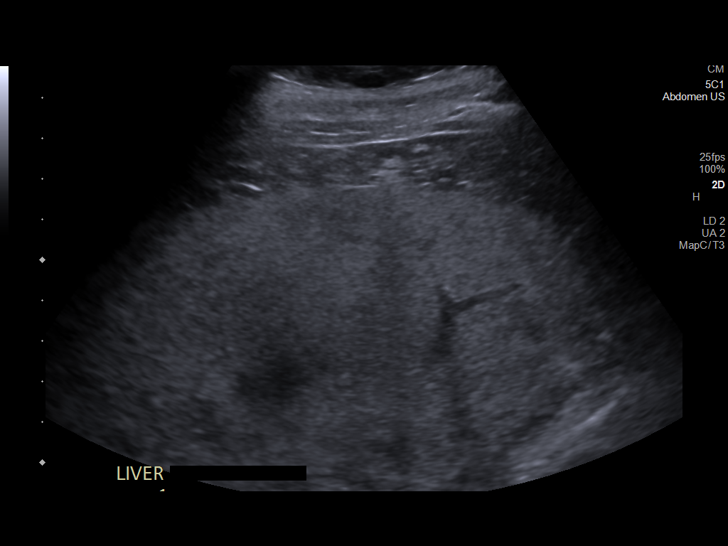

[12 of 25 positions shown; findings below may reference images not displayed]

FINDINGS: ULTRASOUND ABDOMEN

Gallbladder: Normally distended without stones or wall thickening.
No pericholecystic fluid or sonographic Murphy sign.

Common bile duct: Diameter: 3 mm, normal

Liver: Heterogeneous increased echogenicity of the liver, can be
seen with cirrhosis, fatty infiltration, and certain infiltrative
disorders. Hepatic margins are questionable minimally irregular.
Hypoechoic renal LEFT lobe 15 x 14 x 16 mm, somewhat atypical
position for focal sparing. This was not identified on the prior CT.
No additional masses. Portal vein is patent on color Doppler imaging
with normal direction of blood flow towards the liver.

IVC: Normal appearance

Pancreas: Normal appearance

Spleen: Normal appearance, 5.3 cm length

Right Kidney: Length: 12.4 cm. Normal morphology without mass or
hydronephrosis.

Left Kidney: Length: 12.1 cm. Normal morphology without mass or
hydronephrosis.

Abdominal aorta: Visualized portion normal caliber, bifurcation
obscured by bowel gas.

Other findings: No free fluid

ULTRASOUND HEPATIC ELASTOGRAPHY

Device: Siemens Helix VTQ

Patient position: Supine

Transducer 5C1

Number of measurements: 10

Hepatic segment:  8

Median kPa: 4.6 kPa

IQR:

IQR/Median kPa ratio:

Data quality:  Good

Diagnostic category:  < or = 5 kPa: high probability of being normal

The use of hepatic elastography is applicable to patients with viral
hepatitis and non-alcoholic fatty liver disease. At this time, there
is insufficient data for the referenced cut-off values and use in
other causes of liver disease, including alcoholic liver disease.
Patients, however, may be assessed by elastography and serve as
their own reference standard/baseline.

In patients with non-alcoholic liver disease, the values suggesting
compensated advanced chronic liver disease (cACLD) may be lower, and
patients may need additional testing with elasticity results of [DATE]
kPa.

Please note that abnormal hepatic elasticity and shear wave
velocities may also be identified in clinical settings other than
with hepatic fibrosis, such as: acute hepatitis, elevated right
heart and central venous pressures including use of beta blockers,
Magh disease (Abrii), infiltrative processes such as
mastocytosis/amyloidosis/infiltrative tumor/lymphoma, extrahepatic
cholestasis, with hyperemia in the post-prandial state, and with
liver transplantation. Correlation with patient history, laboratory
data, and clinical condition recommended.

Diagnostic Categories:

< or =5 kPa: high probability of being normal

< or =9 kPa: in the absence of other known clinical signs, rules [DATE] kPa and ?13 kPa: suggestive of cACLD, but needs further testing

>13 kPa: highly suggestive of cACLD

> or =17 kPa: highly suggestive of cACLD with an increased
probability of clinically significant portal hypertension
IMPRESSION: ULTRASOUND ABDOMEN:

Question fatty versus cirrhotic liver.

1.6 cm diameter hypoechoic focus in LEFT lobe, cannot exclude mass.

This was not visualized on the prior noncontrast CT.

Further characterization by MR imaging with and without contrast
recommended to exclude hepatic neoplasm.

ULTRASOUND HEPATIC ELASTOGRAPHY:

Median kPa: 4.6 kPa

Diagnostic category:  < or = 5 kPa: high probability of being normal
